# Patient Record
Sex: Male | Born: 1985 | Race: Black or African American | Hispanic: No | Marital: Single | Smoking: Former smoker
Health system: Southern US, Community
[De-identification: ages and names within clinical notes are randomized; demographics above are authoritative.]

## PROBLEM LIST (undated history)

## (undated) DIAGNOSIS — I10 Essential (primary) hypertension: Secondary | ICD-10-CM

## (undated) DIAGNOSIS — G510 Bell's palsy: Secondary | ICD-10-CM

---
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
## 2010-04-01 ENCOUNTER — Ambulatory Visit: Payer: Self-pay | Admitting: Internal Medicine

## 2011-05-28 ENCOUNTER — Emergency Department: Payer: Self-pay | Admitting: Emergency Medicine

## 2013-05-24 ENCOUNTER — Emergency Department: Payer: Self-pay | Admitting: Internal Medicine

## 2013-05-24 LAB — COMPREHENSIVE METABOLIC PANEL
Albumin: 4.4 g/dL (ref 3.4–5.0)
Bilirubin,Total: 0.8 mg/dL (ref 0.2–1.0)
Chloride: 106 mmol/L (ref 98–107)
Co2: 25 mmol/L (ref 21–32)
EGFR (African American): >60
EGFR (Non-African Amer.): >60
Osmolality: 275 (ref 275–301)
Potassium: 3.9 mmol/L (ref 3.5–5.1)
SGOT(AST): 31 U/L (ref 15–37)
SGPT (ALT): 32 U/L (ref 12–78)
Total Protein: 8.2 g/dL (ref 6.4–8.2)

## 2013-05-24 LAB — URINALYSIS, COMPLETE
Bilirubin,UR: NEGATIVE
Glucose,UR: NEGATIVE mg/dL (ref 0–75)
Leukocyte Esterase: NEGATIVE
Ph: 5 (ref 4.5–8.0)
Protein: 30
RBC,UR: 2 /HPF (ref 0–5)
Specific Gravity: 1.03 (ref 1.003–1.030)
WBC UR: 5 /HPF (ref 0–5)

## 2013-05-24 LAB — DRUG SCREEN, URINE
Amphetamines, Ur Screen: NEGATIVE (ref ?–1000)
Barbiturates, Ur Screen: NEGATIVE (ref ?–200)
Benzodiazepine, Ur Scrn: NEGATIVE (ref ?–200)
Cannabinoid 50 Ng, Ur ~~LOC~~: NEGATIVE (ref ?–50)
Opiate, Ur Screen: NEGATIVE (ref ?–300)
Phencyclidine (PCP) Ur S: NEGATIVE (ref ?–25)

## 2013-05-24 LAB — CBC
HCT: 45.2 % (ref 40.0–52.0)
MCH: 27 pg (ref 26.0–34.0)
MCHC: 32.9 g/dL (ref 32.0–36.0)
MCV: 82 fL (ref 80–100)
Platelet: 214 10*3/uL (ref 150–440)
RBC: 5.52 10*6/uL (ref 4.40–5.90)
WBC: 8.6 10*3/uL (ref 3.8–10.6)

## 2013-05-24 LAB — ETHANOL
Ethanol %: <0.003 % (ref 0.000–0.080)
Ethanol: <3 mg/dL

## 2014-11-27 ENCOUNTER — Observation Stay: Payer: Self-pay | Admitting: Internal Medicine

## 2014-11-27 LAB — CBC WITH DIFFERENTIAL/PLATELET
Basophil #: 0.1 10*3/uL (ref 0.0–0.1)
Basophil %: 0.9 %
Eosinophil #: 0.4 10*3/uL (ref 0.0–0.7)
Eosinophil %: 4.2 %
HCT: 46.9 % (ref 40.0–52.0)
HGB: 15.5 g/dL (ref 13.0–18.0)
Lymphocyte #: 1.9 10*3/uL (ref 1.0–3.6)
Lymphocyte %: 20 %
MCH: 27.1 pg (ref 26.0–34.0)
MCHC: 33 g/dL (ref 32.0–36.0)
MCV: 82 fL (ref 80–100)
MONOS PCT: 12.6 %
Monocyte #: 1.2 x10 3/mm — ABNORMAL HIGH (ref 0.2–1.0)
Neutrophil #: 5.8 10*3/uL (ref 1.4–6.5)
Neutrophil %: 62.3 %
Platelet: 225 10*3/uL (ref 150–440)
RBC: 5.7 10*6/uL (ref 4.40–5.90)
RDW: 14.2 % (ref 11.5–14.5)
WBC: 9.3 10*3/uL (ref 3.8–10.6)

## 2014-11-27 LAB — TROPONIN I
Troponin-I: 0.17 ng/mL — ABNORMAL HIGH
Troponin-I: 0.15 ng/mL — ABNORMAL HIGH

## 2014-11-27 LAB — BASIC METABOLIC PANEL
ANION GAP: 7 (ref 7–16)
BUN: 14 mg/dL (ref 7–18)
CHLORIDE: 102 mmol/L (ref 98–107)
Calcium, Total: 9.2 mg/dL (ref 8.5–10.1)
Co2: 29 mmol/L (ref 21–32)
Creatinine: 1.33 mg/dL — ABNORMAL HIGH (ref 0.60–1.30)
EGFR (African American): >60
EGFR (Non-African Amer.): >60
GLUCOSE: 90 mg/dL (ref 65–99)
OSMOLALITY: 276 (ref 275–301)
POTASSIUM: 3.4 mmol/L — AB (ref 3.5–5.1)
Sodium: 138 mmol/L (ref 136–145)

## 2014-11-27 LAB — PROTIME-INR
INR: 1
PROTHROMBIN TIME: 13.1 s (ref 11.5–14.7)

## 2014-11-27 LAB — APTT: Activated PTT: 27.5 secs (ref 23.6–35.9)

## 2014-11-28 DIAGNOSIS — I517 Cardiomegaly: Secondary | ICD-10-CM

## 2014-11-28 LAB — CBC WITH DIFFERENTIAL/PLATELET
BASOS PCT: 0.9 %
Basophil #: 0.1 10*3/uL (ref 0.0–0.1)
EOS PCT: 5.5 %
Eosinophil #: 0.4 10*3/uL (ref 0.0–0.7)
HCT: 43.4 % (ref 40.0–52.0)
HGB: 14.3 g/dL (ref 13.0–18.0)
LYMPHS ABS: 2.3 10*3/uL (ref 1.0–3.6)
Lymphocyte %: 29.2 %
MCH: 27.3 pg (ref 26.0–34.0)
MCHC: 33 g/dL (ref 32.0–36.0)
MCV: 83 fL (ref 80–100)
Monocyte #: 1.1 x10 3/mm — ABNORMAL HIGH (ref 0.2–1.0)
Monocyte %: 13.7 %
NEUTROS PCT: 50.7 %
Neutrophil #: 3.9 10*3/uL (ref 1.4–6.5)
PLATELETS: 207 10*3/uL (ref 150–440)
RBC: 5.25 10*6/uL (ref 4.40–5.90)
RDW: 14.1 % (ref 11.5–14.5)
WBC: 7.8 10*3/uL (ref 3.8–10.6)

## 2014-11-28 LAB — BASIC METABOLIC PANEL
Anion Gap: 7 (ref 7–16)
BUN: 12 mg/dL (ref 7–18)
CHLORIDE: 103 mmol/L (ref 98–107)
CREATININE: 1.23 mg/dL (ref 0.60–1.30)
Calcium, Total: 8.5 mg/dL (ref 8.5–10.1)
Co2: 28 mmol/L (ref 21–32)
EGFR (Non-African Amer.): >60
Glucose: 107 mg/dL — ABNORMAL HIGH (ref 65–99)
Osmolality: 276 (ref 275–301)
Potassium: 3.5 mmol/L (ref 3.5–5.1)
Sodium: 138 mmol/L (ref 136–145)

## 2014-11-28 LAB — TROPONIN I: TROPONIN-I: 0.08 ng/mL — AB

## 2014-11-28 LAB — HEMOGLOBIN A1C: HEMOGLOBIN A1C: 6.3 % (ref 4.2–6.3)

## 2014-11-28 LAB — LIPID PANEL
Cholesterol: 185 mg/dL (ref 0–200)
HDL: 36 mg/dL — AB (ref 40–60)
Ldl Cholesterol, Calc: 93 mg/dL (ref 0–100)
Triglycerides: 279 mg/dL — ABNORMAL HIGH (ref 0–200)
VLDL Cholesterol, Calc: 56 mg/dL — ABNORMAL HIGH (ref 5–40)

## 2015-02-28 NOTE — Discharge Summary (Signed)
PATIENT NAME:  Derek SettlerGRAVES, Hasan T MR#:  [id number] DATE OF BIRTH:  April 16, 1986  DATE OF ADMISSION:  11/27/2014 DATE OF DISCHARGE:  11/28/2014  DISCHARGE DIAGNOSES:   1.  Transient ischemic attack.  2.  Bells palsy. 3.  Hypertension.  4.  Hyperlipidemia.  5.  Elevated troponin.   DISCHARGE MEDICATIONS: 1.  Prednisone 10 mg oral tablet, taper by 10 mg daily until complete.  2.  Atorvastatin 10 mg oral tablet once a day.  3.  Aspirin 81 mg once a day.  4.  Amlodipine 10 mg once a day.   DIET: Low-sodium, low-fat diet.   FOLLOWUP: Advised to follow with primary care physician in 1-2 weeks.   HISTORY OF PRESENTING ILLNESS: A 29 year old male who did not have any medical history and was not following with any primary care physician, who stated he had a stroke at 29 years of age. Since previous day morning, he started feeling weakness on his left side of the face, which was gone in 15-20 minutes, but then reappeared again on the day of presentation. It was difficult for him to drink liquids, so he decided to come to Emergency Room. He also had some tingling in left arm, and so the ER physician admitted him for possible stroke.   HOSPITAL COURSE AND STAY:  1.  Transient ischemic attack. The patient did complain of tingling in the left arm and the left face. MRI and echocardiogram were done, which did not show any source of source of stroke. The patient was first started on aspirin and statin. He was advised to lose some weight and was discharged home. It might be also possible that it is Bells palsy, so he will wear an eye patch and we put him on an oral steroid. 2.  Elevated troponin. Telemetry was monitored and troponin followed, which remained stable, so no further workup was done for that, as the patient did not have any chest pain.  4.  Hypertension. Blood pressure was stable on amlodipine.  5.  Mild renal insufficiency. Creatinine was 1.3, and on monitoring it improved.  6.  Hypokalemia.  Potassium was 3.4. We replaced, and it came up.   IMPORTANT LABORATORY RESULTS IN THE HOSPITAL:  1.  WBC count on presentation 9.3, hemoglobin 15.5, platelet count 225,000; MCV was 82.  2.  Glucose is 19, creatinine 1.33, sodium 138, potassium is 3.4.  3.  Troponin 0.17.  4.  Cholesterol remained 185, triglycerides 279 and LDL 93.  5.  Echocardiogram reported an ejection fraction 60-65%, left ventricular systolic function normal, mild dilated left atrium.  6.  MRI of the brain showed normal MRI. 7.  Ultrasound carotid Doppler study showed no significant carotid plaque or stenosis.   TOTAL TIME SPENT ON THIS DISCHARGE: 40 minutes.    ____________________________ Hope PigeonVaibhavkumar G. Elisabeth PigeonVachhani, MD vgv:MT D: 12/02/2014 07:57:23 ET T: 12/02/2014 11:15:02 ET JOB#: [id number]  cc: Hope PigeonVaibhavkumar G. Elisabeth PigeonVachhani, MD, <Dictator> Altamese DillingVAIBHAVKUMAR Junko Ohagan MD ELECTRONICALLY SIGNED 12/15/2014 10:44

## 2015-02-28 NOTE — H&P (Signed)
PATIENT NAME:  Derek Bell, Derek Bell DATE OF BIRTH:  Mar 30, 1986  DATE OF ADMISSION:  11/27/2014  PRIMARY CARE PHYSICIAN: None.   REFERRING EMERGENCY ROOM PHYSICIAN:  Gayla DossEryka A Gayle, MD    CHIEF COMPLAINT: Facial weakness and tingling in the arm.   HISTORY OF PRESENT ILLNESS: A 29 year old male who does not have any medical history, but does not follow with any primary care physician; he has a sister who had a stroke at age 29. Yesterday morning he had weakness on his left side of the face which was gone within 15 to 20 minutes, so he did not pay much attention to that; today morning again he noticed the weakness on the left side of the face, but it is not going away and was making him difficult to drink liquids, so he decided to come to the Emergency Room and he also complained of having some tingling in his left arm so the ER physician decided to keep him under observation for possible TIA because of tingling in the arm associated with facial weakness.   On further questioning, the patient denies any palpitation, shortness of breath, headache, visual or hearing changes; denies any similar symptoms in the past or any viral symptoms in few days.   REVIEW OF SYSTEMS:  CONSTITUTIONAL: Negative for fever, fatigue, weakness, pain, or weight loss.  EYES: No blurring, double vision, discharge or redness.  EARS AND NOSE AND THROAT: No tinnitus, ear pain or hearing loss.  RESPIRATORY: No cough, wheezing, hemoptysis, or shortness of breath.  CARDIOVASCULAR: No chest pain, orthopnea, edema, arrhythmia, palpitations.  GASTROINTESTINAL: No nausea, vomiting, diarrhea, or abdominal pain.  GENITOURINARY: No dysuria, hematuria, increased frequency.  ENDOCRINE: No heat or cold intolerance. No excessive sweating.  SKIN: No acne, rashes, or lesions.  MUSCULOSKELETAL: No pain or swelling in the joints.  NEUROLOGICAL: Some facial weakness on the left side but otherwise, no tremor, rigidity, or focal  weakness.  PSYCHIATRIC: No anxiety, insomnia; bipolar disorder.   JOINTS:  No swelling or pain.   PAST MEDICAL HISTORY: None, he does not go to any doctor.   PAST SURGICAL HISTORY: None.   SOCIAL HISTORY: No smoking. No alcohol. No illegal drug use. He works in [employer] office.   FAMILY HISTORY: Positive for stroke in his sister at age of 29. He has multiple family members having diabetes, hypertension, and coronary artery disease but all of them are having onset of these diseases at age more than 50 years.   HOME MEDICATIONS:  None.    VITAL SIGNS: In ER, temperature 99, pulse is 93, respirations 20, blood pressure 181/85, and pulse oximetry is 100 on room air.   PHYSICAL EXAMINATION:  GENERAL: The patient is fully alert and oriented to time, place, and person. He is obese, not in any acute distress.  HEENT: Head and neck atraumatic. Conjunctivae are pink. There are some tears rolling from his left eye corner; oral mucosa moist, conjunctivae pink.  NECK: Supple, no JVD.  RESPIRATORY: Bilateral equal and clear air entry. No wheezing or crepitation.  CARDIOVASCULAR: S1, S2, present, regular. No murmur.  ABDOMEN: Soft, nontender. Bowel sounds present. No organomegaly.  SKIN: No acne, rashes, or lesions.  LEGS: No edema.  JOINTS: No swelling or tenderness.  NEUROLOGIC: There is a left-sided facial weakness, all other cranial nerves are intact; power 5/5 in all 4 limbs, no tremor or rigidity, follows command.  PSYCHIATRIC: He does not appear in any acute psychiatric illness at this time.  IMPORTANT LABORATORY RESULTS:  1.  Glucose 90, BUN 14, creatinine 1.33, sodium 138, potassium 3.4, chloride 102, CO2 is 29, calcium is 9.2.  2.  Troponin is 0.17, WBC 9.3, hemoglobin 15.5, platelet count is 225, and MCV is 82.  3.  INR is 1.  4.  CT scan of the head without contrast is done, it is normal without any significant findings.  5.  Chest x-ray, portable, is also done, no radiographic evidence of  acute cardiopulmonary disease.   ASSESSMENT AND PLAN: A 29 year old male with no past medical history came to the Emergency Room with left facial weakness and tingling in left arm, admitting for transient ischemic attack.  1.  Transient ischemic attack. As patient also had complaint of tingling in his left arm, we will take it as a transient ischemic attack admit to telemetry, follow his lipid profile, his MRI, echocardiogram and carotid Doppler studies and start him on aspirin. He does not have a primary care and does not go to doctors, so we would also like to check his hemoglobin A1c because he is obese.  2.  Elevated troponin. We are admitting him to telemetry and we will follow serial troponins, echocardiogram is being done for transient ischemic attack anyways; further planning after these results.  3.  Hypertension. Blood pressure is elevated. He does not have any history, but he does not go to any doctor either; so, would start him on amlodipine and we will follow.  4.  Mild renal insufficiency. Creatinine is 1.3, monitor tomorrow.  5.  Mild hypokalemia. Potassium 3.4, we will replace and check tomorrow.   CODE STATUS: Full code.   Total time spent on this admission was 45 minutes.     ____________________________ Hope Pigeon Elisabeth Pigeon, MD vgv:nt D: 11/27/2014 19:15:02 ET T: 11/27/2014 19:33:29 ET JOB#: [id number]  cc: Hope Pigeon. Elisabeth Pigeon, MD, <Dictator> Altamese Dilling MD ELECTRONICALLY SIGNED 12/15/2014 10:38

## 2015-11-09 ENCOUNTER — Emergency Department: Payer: Federal, State, Local not specified - PPO

## 2015-11-09 ENCOUNTER — Emergency Department
Admission: EM | Admit: 2015-11-09 | Discharge: 2015-11-09 | Disposition: A | Discharge status: Home / Self Care | Payer: Federal, State, Local not specified - PPO | Attending: Emergency Medicine | Admitting: Emergency Medicine

## 2015-11-09 ENCOUNTER — Encounter: Payer: Self-pay | Admitting: *Deleted

## 2015-11-09 DIAGNOSIS — R05 Cough: Secondary | ICD-10-CM | POA: Diagnosis not present

## 2015-11-09 DIAGNOSIS — R509 Fever, unspecified: Secondary | ICD-10-CM | POA: Diagnosis not present

## 2015-11-09 DIAGNOSIS — E669 Obesity, unspecified: Secondary | ICD-10-CM | POA: Insufficient documentation

## 2015-11-09 DIAGNOSIS — H9209 Otalgia, unspecified ear: Secondary | ICD-10-CM | POA: Diagnosis not present

## 2015-11-09 DIAGNOSIS — R252 Cramp and spasm: Secondary | ICD-10-CM | POA: Insufficient documentation

## 2015-11-09 DIAGNOSIS — R059 Cough, unspecified: Secondary | ICD-10-CM

## 2015-11-09 DIAGNOSIS — R112 Nausea with vomiting, unspecified: Secondary | ICD-10-CM

## 2015-11-09 DIAGNOSIS — R197 Diarrhea, unspecified: Secondary | ICD-10-CM | POA: Diagnosis not present

## 2015-11-09 HISTORY — DX: Bell's palsy: G51.0

## 2015-11-09 LAB — COMPREHENSIVE METABOLIC PANEL
ALK PHOS: 75 U/L (ref 38–126)
ALT: 33 U/L (ref 17–63)
AST: 25 U/L (ref 15–41)
Albumin: 5.2 g/dL — ABNORMAL HIGH (ref 3.5–5.0)
Anion gap: 12 (ref 5–15)
BUN: 9 mg/dL (ref 6–20)
CALCIUM: 9.6 mg/dL (ref 8.9–10.3)
CHLORIDE: 102 mmol/L (ref 101–111)
CO2: 24 mmol/L (ref 22–32)
CREATININE: 1.11 mg/dL (ref 0.61–1.24)
Glucose, Bld: 92 mg/dL (ref 65–99)
Potassium: 3.9 mmol/L (ref 3.5–5.1)
Sodium: 138 mmol/L (ref 135–145)
Total Bilirubin: 1 mg/dL (ref 0.3–1.2)
Total Protein: 8.6 g/dL — ABNORMAL HIGH (ref 6.5–8.1)

## 2015-11-09 LAB — LIPASE, BLOOD: LIPASE: 21 U/L (ref 11–51)

## 2015-11-09 LAB — CBC
HCT: 48.8 % (ref 40.0–52.0)
Hemoglobin: 16 g/dL (ref 13.0–18.0)
MCH: 26.3 pg (ref 26.0–34.0)
MCHC: 32.7 g/dL (ref 32.0–36.0)
MCV: 80.3 fL (ref 80.0–100.0)
PLATELETS: 236 10*3/uL (ref 150–440)
RBC: 6.08 MIL/uL — ABNORMAL HIGH (ref 4.40–5.90)
RDW: 14.3 % (ref 11.5–14.5)
WBC: 9 10*3/uL (ref 3.8–10.6)

## 2015-11-09 LAB — URINALYSIS COMPLETE WITH MICROSCOPIC (ARMC ONLY)
Bacteria, UA: NONE SEEN
Bilirubin Urine: NEGATIVE
Glucose, UA: NEGATIVE mg/dL
HGB URINE DIPSTICK: NEGATIVE
Ketones, ur: NEGATIVE mg/dL
LEUKOCYTES UA: NEGATIVE
Nitrite: NEGATIVE
PH: 5 (ref 5.0–8.0)
PROTEIN: NEGATIVE mg/dL
RBC / HPF: NONE SEEN RBC/hpf (ref 0–5)
SPECIFIC GRAVITY, URINE: 1.018 (ref 1.005–1.030)

## 2015-11-09 MED ORDER — ONDANSETRON HCL 4 MG/2ML IJ SOLN
4.0000 mg | Freq: Once | INTRAMUSCULAR | Status: AC
  Administered 2015-11-09: 4 mg via INTRAVENOUS
  Filled 2015-11-09: qty 2

## 2015-11-09 MED ORDER — SODIUM CHLORIDE 0.9 % IV BOLUS (SEPSIS)
1000.0000 mL | Freq: Once | INTRAVENOUS | Status: AC
  Administered 2015-11-09: 1000 mL via INTRAVENOUS

## 2015-11-09 MED ORDER — DIPHENOXYLATE-ATROPINE 2.5-0.025 MG PO TABS
1.0000 | ORAL_TABLET | Freq: Once | ORAL | Status: AC
  Administered 2015-11-09: 1 via ORAL
  Filled 2015-11-09: qty 1

## 2015-11-09 MED ORDER — ONDANSETRON HCL 4 MG PO TABS: 4.0000 mg | ORAL_TABLET | Freq: Every day | ORAL | Status: AC | PRN

## 2015-11-09 MED ORDER — LOPERAMIDE HCL 2 MG PO TABS: 2.0000 mg | ORAL_TABLET | Freq: Four times a day (QID) | ORAL | Status: AC | PRN

## 2015-11-09 NOTE — ED Provider Notes (Signed)
[facility] Emergency Department Provider Note  ____________________________________________  Time seen: Approximately 7:17 PM  I have reviewed the triage vital signs and the nursing notes.   HISTORY  Chief Complaint Nausea; Emesis; and Diarrhea    HPI Derek Bell. is a 30 y.o. male , otherwise healthy, presenting with cough, fever, nausea, vomiting, diarrhea. Patient reports that for the past several weeks he has had a nonproductive cough. Intermittently he has also had fever with congestion. No sore throat or ear pain. Over the past several weeks he's had several episodes of nausea vomiting and diarrhea, but over the past 2 days he has been unable to tolerate any by mouth and has had an increased frequency in his loose nonbloody stool.He has had diffuse abdominal cramping that is intermittent without focality. Last fever was greater than 2 days ago. No urinary symptoms. No travel outside the [place]. Multiple sick contacts at work. Did not receive a flu vaccination this year.   Past Medical History  Diagnosis Date  . Bell's palsy     There are no active problems to display for this patient.   History reviewed. No pertinent past surgical history.  Current Outpatient Rx  Name  Route  Sig  Dispense  Refill  . loperamide (IMODIUM A-D) 2 MG tablet   Oral   Take 1 tablet (2 mg total) by mouth 4 (four) times daily as needed for diarrhea or loose stools.   12 tablet   0   . ondansetron (ZOFRAN) 4 MG tablet   Oral   Take 1 tablet (4 mg total) by mouth daily as needed for nausea or vomiting.   20 tablet   0     Allergies Review of patient's allergies indicates no known allergies.  History reviewed. No pertinent family history.  Social History Social History  Substance Use Topics  . Smoking status: Never Smoker   . Smokeless tobacco: None  . Alcohol Use: None    Review of Systems Constitutional: Positive fever. No lightheadedness  or syncope. Eyes: No visual changes. No eye discharge. ENT: No sore throat. Oh ear pain. Cardiovascular: Denies chest pain, palpitations. Respiratory: Denies shortness of breath.  No cough. Gastrointestinal: Positive abdominal cramping.  Positive nausea, positive vomiting.  Positive diarrhea.  No constipation. Genitourinary: Negative for dysuria. Musculoskeletal: Negative for back pain. Skin: Negative for rash. Neurological: Negative for headaches, focal weakness or numbness.  10-point ROS otherwise negative.  ____________________________________________   PHYSICAL EXAM:  VITAL SIGNS: ED Triage Vitals  Enc Vitals Group     BP 11/09/15 1607 164/102 mmHg     Pulse Rate 11/09/15 1607 91     Resp 11/09/15 1607 18     Temp 11/09/15 1607 98.3 F (36.8 C)     Temp Source 11/09/15 1607 Oral     SpO2 11/09/15 1607 97 %     Weight 11/09/15 1607 305 lb (138.347 kg)     Height 11/09/15 1607 6\' 1"  (1.854 m)     Head Cir --      Peak Flow --      Pain Score 11/09/15 1611 5     Pain Loc --      Pain Edu? --      Excl. in GC? --     Constitutional: Alert and oriented. Well appearing and in no acute distress. Answer question appropriately. Eyes: Conjunctivae are normal.  EOMI. no eye discharge. No scleral icterus. Head: Atraumatic. Nose: Positive congestion without rhinnorhea. Mouth/Throat:  Mucous membranes are dry. No posterior pharyngeal erythema, no tonsillar swelling or exudate. Uvula is midline and palate is symmetric. Neck: No stridor.  Supple.  JVD. Cardiovascular: Normal rate, regular rhythm. No murmurs, rubs or gallops.  Respiratory: Normal respiratory effort.  No retractions. Lungs CTAB.  No wheezes, rales or ronchi. Gastrointestinal: Obese. Soft and nontender. No distention. No peritoneal signs. Musculoskeletal: No LE edema.  Neurologic:  Normal speech and language. No gross focal neurologic deficits are appreciated.  Skin:  Skin is warm, dry and intact. No rash  noted. Psychiatric: Mood and affect are normal. Speech and behavior are normal.  Normal judgement  ____________________________________________   LABS (all labs ordered are listed, but only abnormal results are displayed)  Labs Reviewed  COMPREHENSIVE METABOLIC PANEL - Abnormal; Notable for the following:    Total Protein 8.6 (*)    Albumin 5.2 (*)    All other components within normal limits  CBC - Abnormal; Notable for the following:    RBC 6.08 (*)    All other components within normal limits  URINALYSIS COMPLETEWITH MICROSCOPIC ([facility] ONLY) - Abnormal; Notable for the following:    Color, Urine YELLOW (*)    APPearance CLEAR (*)    Squamous Epithelial / LPF 0-5 (*)    All other components within normal limits  LIPASE, BLOOD   ____________________________________________  EKG  Not indicated ____________________________________________  RADIOLOGY  Dg Chest 2 View  11/09/2015  CLINICAL DATA:  Cough. EXAM: CHEST  2 VIEW COMPARISON:  November 27, 2014. FINDINGS: The heart size and mediastinal contours are within normal limits. Both lungs are clear. No pneumothorax or pleural effusion is noted. The visualized skeletal structures are unremarkable. IMPRESSION: No active cardiopulmonary disease. Electronically Signed   By: Lupita RaiderJames  Green Jr, M.D.   On: 11/09/2015 19:44    ____________________________________________   PROCEDURES  Procedure(s) performed: None  Critical Care performed: No ____________________________________________   INITIAL IMPRESSION / ASSESSMENT AND PLAN / ED COURSE  Pertinent labs & imaging results that were available during my care of the patient were reviewed by me and considered in my medical decision making (see chart for details).  30 y.o. male, otherwise healthy, presenting with several weeks and more recently increased cough and cold symptoms with nausea vomiting and diarrhea. On exam, the patient is nontoxic and overall well-appearing. His  abdominal exam is reassuring and he has no acute pulmonary abnormalities. He has stable vital signs. His labs show normal white blood cell count and no abnormalities in his electrolytes. I will plan to get a UA and chest x-ray to rule out pneumonia, and initiate aggressive symptomatic treatment. Anticipate discharge home with close PMD follow-up.  ____________________________________________  FINAL CLINICAL IMPRESSION(S) / ED DIAGNOSES  Final diagnoses:  Nausea vomiting and diarrhea  Cough      NEW MEDICATIONS STARTED DURING THIS VISIT:  Discharge Medication List as of 11/09/2015  8:58 PM    START taking these medications   Details  loperamide (IMODIUM A-D) 2 MG tablet Take 1 tablet (2 mg total) by mouth 4 (four) times daily as needed for diarrhea or loose stools., Starting 11/09/2015, Until Discontinued, Print    ondansetron (ZOFRAN) 4 MG tablet Take 1 tablet (4 mg total) by mouth daily as needed for nausea or vomiting., Starting 11/09/2015, Until Wed 11/08/16, Print         Rockne MenghiniAnne-Caroline Neima Lacross, MD 11/10/15 0003

## 2015-11-09 NOTE — Discharge Instructions (Signed)
Please take a clear liquid diet for the next 24-48 hours. Then advance to bland BRAT diet as tolerated. You may take Zofran for nausea and loperamide for diarrhea.  Please return to the emergency Department if you are unable to keep down liquids, develop fever, lightheadedness or fainting, abdominal pain, or any other symptoms concerning to you.

## 2015-11-09 NOTE — ED Notes (Addendum)
Pt states nausea vomiting an diaherra for 2 days, states he has been unable to keep anything down, awake and alert, states people at work have been sick

## 2017-09-18 ENCOUNTER — Other Ambulatory Visit: Payer: Self-pay

## 2017-09-18 ENCOUNTER — Encounter: Payer: Self-pay | Admitting: Emergency Medicine

## 2017-09-18 ENCOUNTER — Emergency Department

## 2017-09-18 ENCOUNTER — Emergency Department
Admission: EM | Admit: 2017-09-18 | Discharge: 2017-09-18 | Disposition: A | Discharge status: Home / Self Care | Attending: Emergency Medicine | Admitting: Emergency Medicine

## 2017-09-18 DIAGNOSIS — S8991XA Unspecified injury of right lower leg, initial encounter: Secondary | ICD-10-CM | POA: Diagnosis present

## 2017-09-18 DIAGNOSIS — Z79899 Other long term (current) drug therapy: Secondary | ICD-10-CM | POA: Diagnosis not present

## 2017-09-18 DIAGNOSIS — Y9389 Activity, other specified: Secondary | ICD-10-CM | POA: Diagnosis not present

## 2017-09-18 DIAGNOSIS — S8001XA Contusion of right knee, initial encounter: Secondary | ICD-10-CM | POA: Diagnosis not present

## 2017-09-18 DIAGNOSIS — Y990 Civilian activity done for income or pay: Secondary | ICD-10-CM | POA: Diagnosis not present

## 2017-09-18 DIAGNOSIS — Y92410 Unspecified street and highway as the place of occurrence of the external cause: Secondary | ICD-10-CM | POA: Diagnosis not present

## 2017-09-18 NOTE — Discharge Instructions (Signed)
Your exam and x-ray are normal at this time. Take OTC ibuprofen and Tylenol as needed for pain. Apply ice to reduce swelling. Follow-up with your company's medical provider for continued symptoms.
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
## 2017-09-18 NOTE — ED Notes (Signed)
Per [employer], no urine drug screen. AS

## 2017-11-05 ENCOUNTER — Encounter: Payer: Self-pay | Admitting: Emergency Medicine

## 2017-11-05 ENCOUNTER — Emergency Department
Admission: EM | Admit: 2017-11-05 | Discharge: 2017-11-05 | Disposition: A | Discharge status: Home / Self Care | Payer: Federal, State, Local not specified - PPO | Attending: Emergency Medicine | Admitting: Emergency Medicine

## 2017-11-05 ENCOUNTER — Other Ambulatory Visit: Payer: Self-pay

## 2017-11-05 DIAGNOSIS — S8991XA Unspecified injury of right lower leg, initial encounter: Secondary | ICD-10-CM | POA: Diagnosis not present

## 2017-11-05 DIAGNOSIS — Y9389 Activity, other specified: Secondary | ICD-10-CM | POA: Diagnosis not present

## 2017-11-05 DIAGNOSIS — Y998 Other external cause status: Secondary | ICD-10-CM | POA: Insufficient documentation

## 2017-11-05 DIAGNOSIS — Z79899 Other long term (current) drug therapy: Secondary | ICD-10-CM | POA: Insufficient documentation

## 2017-11-05 DIAGNOSIS — G510 Bell's palsy: Secondary | ICD-10-CM | POA: Insufficient documentation

## 2017-11-05 DIAGNOSIS — Y92410 Unspecified street and highway as the place of occurrence of the external cause: Secondary | ICD-10-CM | POA: Insufficient documentation

## 2017-11-05 DIAGNOSIS — I10 Essential (primary) hypertension: Secondary | ICD-10-CM | POA: Diagnosis not present

## 2017-11-05 DIAGNOSIS — Z87891 Personal history of nicotine dependence: Secondary | ICD-10-CM | POA: Diagnosis not present

## 2017-11-05 HISTORY — DX: Essential (primary) hypertension: I10

## 2017-11-05 MED ORDER — MELOXICAM 15 MG PO TABS: 15.0000 mg | ORAL_TABLET | Freq: Every day | ORAL | 0 refills | Status: AC

## 2017-11-05 MED ORDER — KETOROLAC TROMETHAMINE 30 MG/ML IJ SOLN
30.0000 mg | Freq: Once | INTRAMUSCULAR | Status: AC
  Administered 2017-11-05: 30 mg via INTRAMUSCULAR
  Filled 2017-11-05: qty 1
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
## 2017-12-07 ENCOUNTER — Ambulatory Visit: Payer: Federal, State, Local not specified - PPO | Admitting: Urology

## 2017-12-07 ENCOUNTER — Encounter: Payer: Self-pay | Admitting: Urology

## 2018-01-08 ENCOUNTER — Telehealth: Payer: Self-pay | Admitting: *Deleted
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
